# Patient Record
Sex: Male | Born: 1990 | Race: White | Hispanic: No | Marital: Single | Smoking: Never smoker
Health system: Southern US, Community
[De-identification: ages and names within clinical notes are randomized; demographics above are authoritative.]

---
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
## 2013-08-09 ENCOUNTER — Other Ambulatory Visit: Payer: Self-pay | Admitting: Internal Medicine

## 2013-08-09 LAB — TROPONIN I: Troponin-I: <0.02 ng/mL

## 2013-08-16 ENCOUNTER — Ambulatory Visit: Payer: Self-pay | Admitting: Internal Medicine

## 2015-08-16 IMAGING — CT CT ANGIO CHEST
2 of 6 series · 16 of 36 positions shown · IV contrast (APPLIED)
Comparison: None.

CLINICAL DATA: Chest pain.

EXAM:
CT ANGIOGRAPHY CHEST WITH CONTRAST
TECHNIQUE: Multidetector CT imaging of the chest was performed using the
standard protocol during bolus administration of intravenous
contrast. Multiplanar CT image reconstructions including MIPs were
obtained to evaluate the vascular anatomy.
CONTRAST:  100 cc Isovue 370.

[Series 8: routine chest with thins · axial · 0.67mm/px · z∈[-642,-388]mm · 15 of 244 slices shown]
[im 16/244  lung]
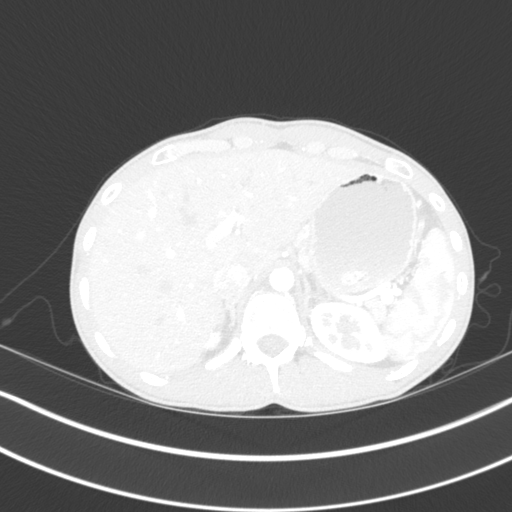
[im 31/244  mediastinal]
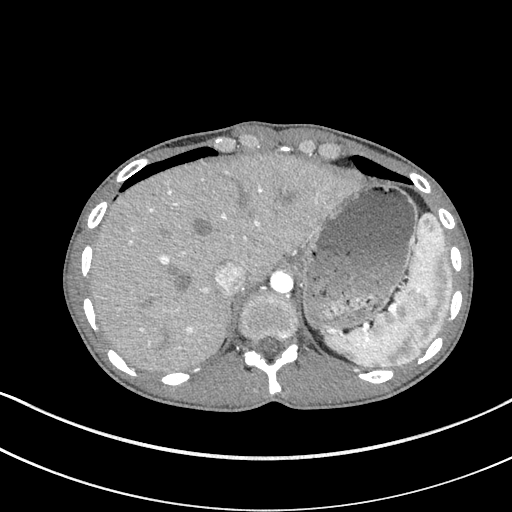
[im 46/244  lung]
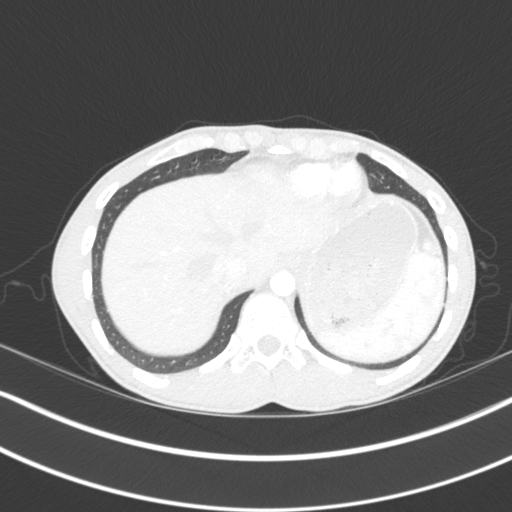
[im 61/244  mediastinal]
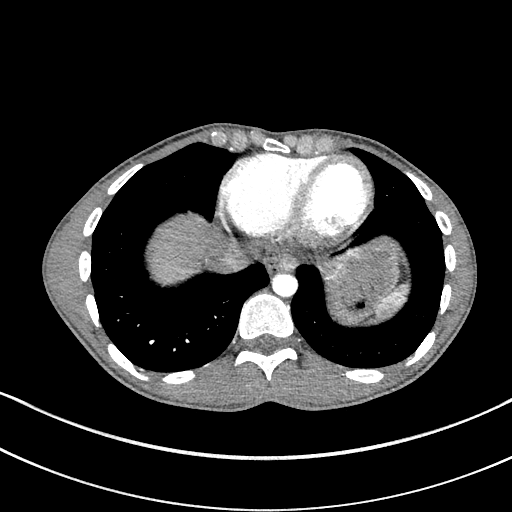
[im 76/244  lung]
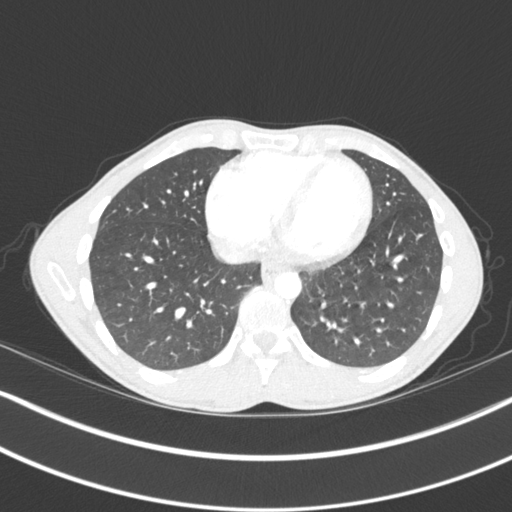
[im 92/244  mediastinal]
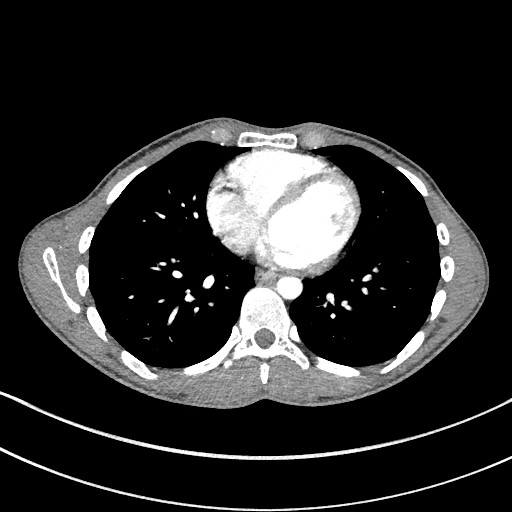
[im 107/244  lung]
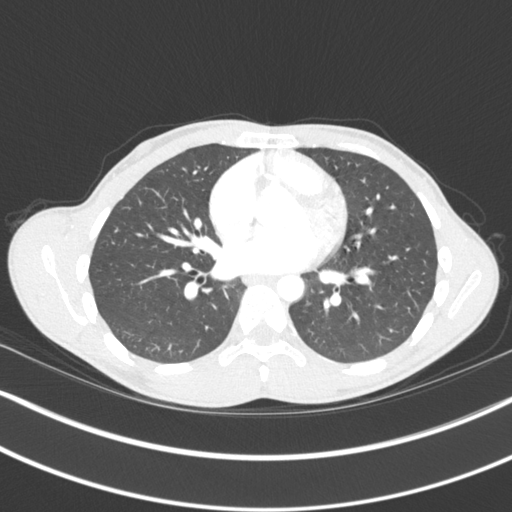
[im 122/244  mediastinal]
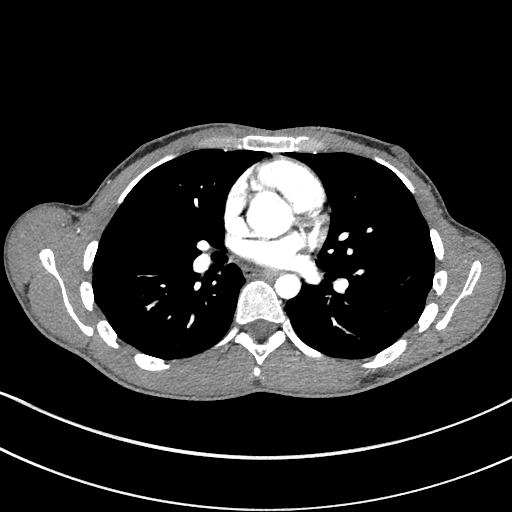
[im 137/244  lung]
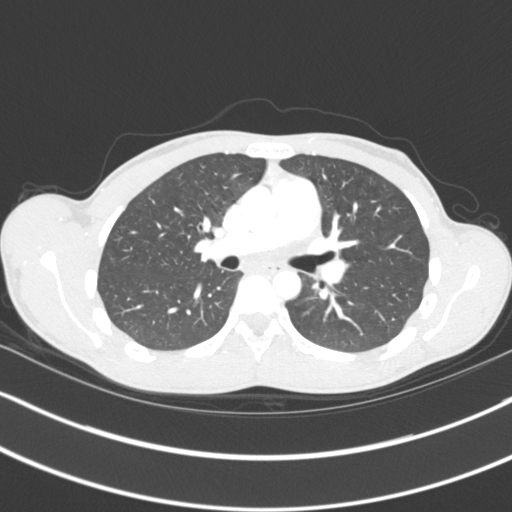
[im 152/244  mediastinal]
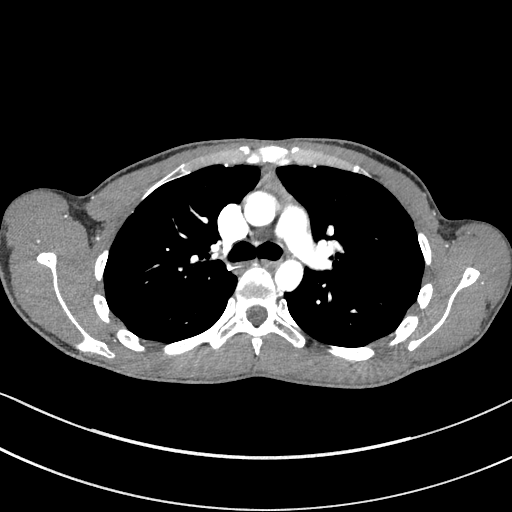
[im 168/244  lung]
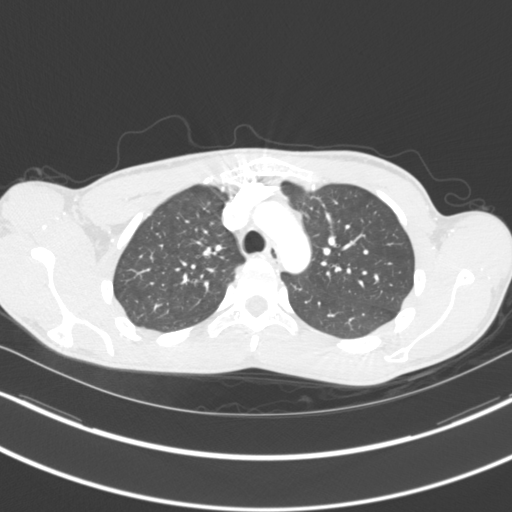
[im 183/244  mediastinal]
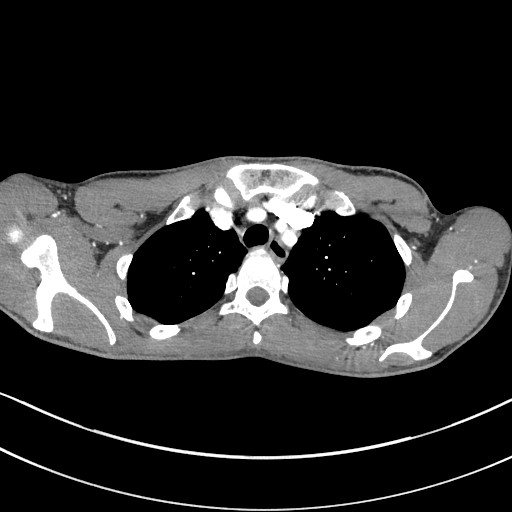
[im 198/244  lung]
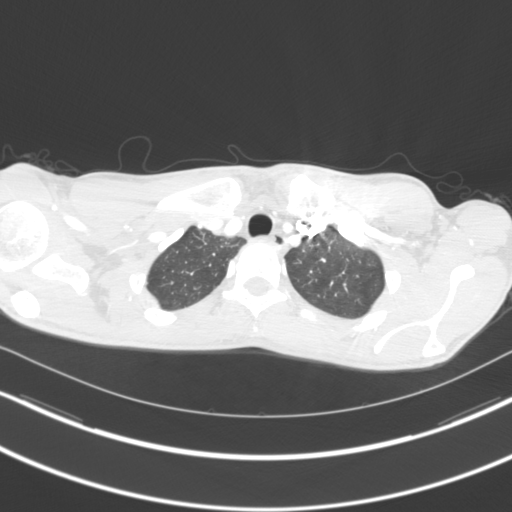
[im 213/244  mediastinal]
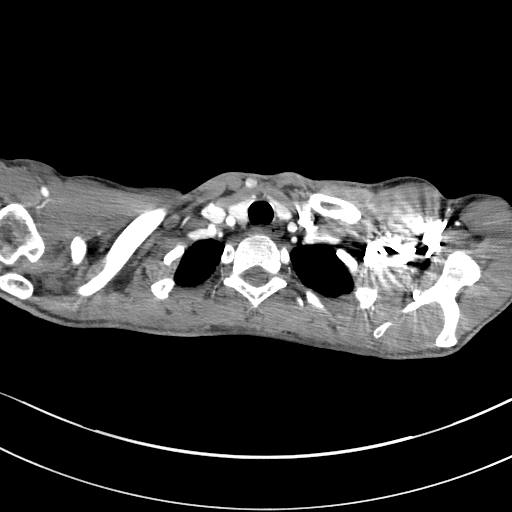
[im 228/244  lung]
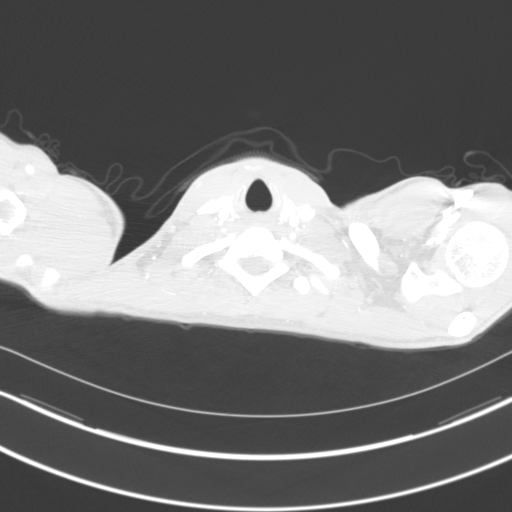

[Series 9: cor routine chest with · coronal · 0.57mm/px · 1 of 101 slices shown]
[im 51/101  mediastinal]
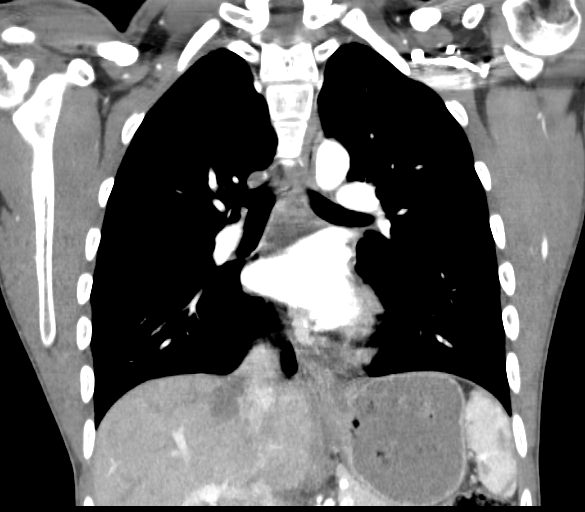

[16 of 36 positions shown; findings below may reference images not displayed]

FINDINGS: Thoracic aorta is unremarkable. Heart size normal. No pulmonary
embolus. Esophagus is unremarkable. Adrenals and visualized upper
abdominal viscera unremarkable.

Large airways are patent. Tiny nonspecific 2 mm nodule present in
the right apex, image 13./series 7. Tiny of approximately 3 mm
nodule present in the left lung base, image number 43/series 7. .
Mild pleural parenchymal thickening is noted bilaterally. These
changes are most likely inflammatory. A follow-up CT in 6 months to
1 year could be obtained to demonstrate stability. No pleural
effusion or pneumothorax.

Thyroid is normal. Supraclavicular regions are normal. Small
axillary lymph nodes are noted bilaterally. Chest wall is intact. No
acute osseous abnormality.

Review of the MIP images confirms the above findings.
IMPRESSION: 1. No evidence of pulmonary embolus. No acute cardiopulmonary
disease is identified.
2. Tiny nonspecific 2-3 mm pulmonary nodules as described above.
These are most likely inflammatory. Follow-up CT in 6-12 months can
be obtained to demonstrate stability.

## 2016-09-24 DIAGNOSIS — R509 Fever, unspecified: Secondary | ICD-10-CM | POA: Diagnosis not present

## 2016-09-24 DIAGNOSIS — J029 Acute pharyngitis, unspecified: Secondary | ICD-10-CM | POA: Diagnosis not present

## 2016-10-23 DIAGNOSIS — H43393 Other vitreous opacities, bilateral: Secondary | ICD-10-CM | POA: Diagnosis not present

## 2017-04-28 DIAGNOSIS — D649 Anemia, unspecified: Secondary | ICD-10-CM | POA: Diagnosis not present

## 2017-04-28 DIAGNOSIS — R202 Paresthesia of skin: Secondary | ICD-10-CM | POA: Diagnosis not present

## 2017-05-05 DIAGNOSIS — Z23 Encounter for immunization: Secondary | ICD-10-CM | POA: Diagnosis not present

## 2017-05-05 DIAGNOSIS — Z0000 Encounter for general adult medical examination without abnormal findings: Secondary | ICD-10-CM | POA: Diagnosis not present

## 2017-05-05 DIAGNOSIS — R200 Anesthesia of skin: Secondary | ICD-10-CM | POA: Diagnosis not present

## 2017-05-05 DIAGNOSIS — D649 Anemia, unspecified: Secondary | ICD-10-CM | POA: Diagnosis not present

## 2017-10-08 DIAGNOSIS — S92514A Nondisplaced fracture of proximal phalanx of right lesser toe(s), initial encounter for closed fracture: Secondary | ICD-10-CM | POA: Diagnosis not present

## 2017-10-08 DIAGNOSIS — M79671 Pain in right foot: Secondary | ICD-10-CM | POA: Diagnosis not present

## 2017-10-22 DIAGNOSIS — S92514D Nondisplaced fracture of proximal phalanx of right lesser toe(s), subsequent encounter for fracture with routine healing: Secondary | ICD-10-CM | POA: Diagnosis not present

## 2017-11-05 DIAGNOSIS — S92514D Nondisplaced fracture of proximal phalanx of right lesser toe(s), subsequent encounter for fracture with routine healing: Secondary | ICD-10-CM | POA: Diagnosis not present

## 2018-04-29 DIAGNOSIS — R200 Anesthesia of skin: Secondary | ICD-10-CM | POA: Diagnosis not present

## 2018-04-29 DIAGNOSIS — D649 Anemia, unspecified: Secondary | ICD-10-CM | POA: Diagnosis not present

## 2018-04-29 DIAGNOSIS — Z0000 Encounter for general adult medical examination without abnormal findings: Secondary | ICD-10-CM | POA: Diagnosis not present

## 2018-05-06 DIAGNOSIS — Z0000 Encounter for general adult medical examination without abnormal findings: Secondary | ICD-10-CM | POA: Diagnosis not present

## 2018-05-06 DIAGNOSIS — R200 Anesthesia of skin: Secondary | ICD-10-CM | POA: Diagnosis not present

## 2020-09-14 ENCOUNTER — Other Ambulatory Visit: Payer: Self-pay

## 2020-09-14 ENCOUNTER — Ambulatory Visit
Admission: RE | Admit: 2020-09-14 | Discharge: 2020-09-14 | Disposition: A | Discharge status: Home / Self Care | Payer: 59 | Source: Ambulatory Visit | Attending: Family Medicine | Admitting: Family Medicine

## 2020-09-14 VITALS — BP 130/89 | HR 104 | Temp 99.5°F | Resp 18 | Ht 66.0 in | Wt 118.0 lb

## 2020-09-14 DIAGNOSIS — R509 Fever, unspecified: Secondary | ICD-10-CM | POA: Insufficient documentation

## 2020-09-14 DIAGNOSIS — U071 COVID-19: Secondary | ICD-10-CM | POA: Diagnosis not present

## 2020-09-14 DIAGNOSIS — R0981 Nasal congestion: Secondary | ICD-10-CM | POA: Diagnosis not present

## 2020-09-14 DIAGNOSIS — J069 Acute upper respiratory infection, unspecified: Secondary | ICD-10-CM

## 2020-09-14 NOTE — ED Triage Notes (Signed)
Pt c/o sore throat, nasal congestion, fever, chills, body aches since Tuesday. Pt was possibly exposed to someone on Monday who had similar symptoms, he is not aware if they were COVID tested.

## 2020-09-14 NOTE — ED Provider Notes (Signed)
[facility]    CSN: [id number] Arrival date & time: 09/14/20  1510      History   Chief Complaint Chief Complaint  Patient presents with  . Sore Throat  . Nasal Congestion  . Generalized Body Aches    HPI Derek Galloway is a 30 y.o. male.   HPI   30 year old male here for evaluation of sore throat, nasal congestion, fever, chills, body aches, and runny nose.  Patient ports that his T-max was 100.  Patient denies ear pain or pressure, cough, shortness of breath, nausea, vomiting, or diarrhea.  Patient is not vaccinated as COVID.  Patient did have a COVID exposure 3 days ago.  History reviewed. No pertinent past medical history.  There are no problems to display for this patient.   History reviewed. No pertinent surgical history.     Home Medications    Prior to Admission medications   Not on File    Family History Family History  Problem Relation Age of Onset  . Healthy Mother   . Healthy Father     Social History Social History   Tobacco Use  . Smoking status: Never Smoker  . Smokeless tobacco: Never Used  Vaping Use  . Vaping Use: Never used  Substance Use Topics  . Alcohol use: Never  . Drug use: Never     Allergies   Patient has no known allergies.   Review of Systems Review of Systems  Constitutional: Positive for chills. Negative for activity change, appetite change and fever.  HENT: Positive for congestion, rhinorrhea and sore throat. Negative for ear pain.   Respiratory: Negative for cough, shortness of breath and wheezing.   Gastrointestinal: Positive for nausea. Negative for diarrhea and vomiting.  Musculoskeletal: Positive for arthralgias and myalgias.  Skin: Negative for rash.  Hematological: Negative.   Psychiatric/Behavioral: Negative.      Physical Exam Triage Vital Signs ED Triage Vitals  Enc Vitals Group     BP 09/14/20 1531 130/89     Pulse Rate 09/14/20 1531 (!) 104     Resp 09/14/20 1531 18     Temp  09/14/20 1531 99.5 F (37.5 C)     Temp Source 09/14/20 1531 Oral     SpO2 09/14/20 1531 100 %     Weight 09/14/20 1528 118 lb (53.5 kg)     Height 09/14/20 1528 5\' 6"  (1.676 m)     Head Circumference --      Peak Flow --      Pain Score 09/14/20 1527 7     Pain Loc --      Pain Edu? --      Excl. in GC? --    No data found.  Updated Vital Signs BP 130/89 (BP Location: Left Arm)   Pulse (!) 104   Temp 99.5 F (37.5 C) (Oral)   Resp 18   Ht 5\' 6"  (1.676 m)   Wt 118 lb (53.5 kg)   SpO2 100%   BMI 19.05 kg/m   Visual Acuity Right Eye Distance:   Left Eye Distance:   Bilateral Distance:    Right Eye Near:   Left Eye Near:    Bilateral Near:     Physical Exam Vitals and nursing note reviewed.  Constitutional:      General: He is not in acute distress.    Appearance: He is well-developed.  HENT:     Head: Normocephalic and atraumatic.     Right Ear: Tympanic membrane and  ear canal normal. Tympanic membrane is not erythematous.     Left Ear: Tympanic membrane and ear canal normal.     Nose: Congestion and rhinorrhea present.     Mouth/Throat:     Mouth: Mucous membranes are moist.     Pharynx: Oropharynx is clear. Posterior oropharyngeal erythema present.     Tonsils: No tonsillar exudate. 0 on the right. 0 on the left.  Cardiovascular:     Rate and Rhythm: Normal rate and regular rhythm.     Heart sounds: Normal heart sounds. No murmur heard.   Pulmonary:     Effort: Pulmonary effort is normal.     Breath sounds: Normal breath sounds. No wheezing, rhonchi or rales.  Musculoskeletal:     Cervical back: Normal range of motion and neck supple.  Lymphadenopathy:     Cervical: No cervical adenopathy.  Skin:    General: Skin is warm and dry.     Capillary Refill: Capillary refill takes less than 2 seconds.     Findings: No erythema or rash.  Neurological:     General: No focal deficit present.     Mental Status: He is alert and oriented to person, place, and  time.  Psychiatric:        Mood and Affect: Mood normal.        Behavior: Behavior normal.      UC Treatments / Results  Labs (all labs ordered are listed, but only abnormal results are displayed) Labs Reviewed  SARS CORONAVIRUS 2 (TAT 6-24 HRS)    EKG   Radiology No results found.  Procedures Procedures (including critical care time)  Medications Ordered in UC Medications - No data to display  Initial Impression / Assessment and Plan / UC Course  I have reviewed the triage vital signs and the nursing notes.  Pertinent labs & imaging results that were available during my care of the patient were reviewed by me and considered in my medical decision making (see chart for details).   Patient is here for evaluation of COVID-like symptoms that started 2 days ago.  Patient is in no acute distress.  Physical exam reveals erythematous and edematous nasal mucosa with clear nasal discharge.  Posterior oropharynx is also erythematous with clear postnasal drip.  Tonsillar pillars are unremarkable.  Patient has no cervical lymphadenopathy on exam.  Lung sounds are clear to auscultation all fields.  Will swab patient for COVID and discharge home with supportive care and ER precautions.   Final Clinical Impressions(s) / UC Diagnoses   Final diagnoses:  Upper respiratory tract infection, unspecified type     Discharge Instructions     Isolate at home until the results of your Covid test are back.  If your test is positive then you will have to quarantine for 5 days from the onset of your symptoms.  After 5 days you can break quarantine if your symptoms have improved and you have not had a fever for 24 hours.  Use over-the-counter Tylenol and ibuprofen as needed for fever and body aches.  If you develop any shortness of breath-especially at rest, you are unable to speak in full sentences, or is a late sign your lips start turning blue you need to go to the ER for evaluation.    ED  Prescriptions    None     PDMP not reviewed this encounter.   Becky Augusta, NP 09/14/20 [id number]

## 2020-09-14 NOTE — Discharge Instructions (Addendum)
Isolate at home until the results of your Covid test are back.  If your test is positive then you will have to quarantine for 5 days from the onset of your symptoms.  After 5 days you can break quarantine if your symptoms have improved and you have not had a fever for 24 hours.  Use over-the-counter Tylenol and ibuprofen as needed for fever and body aches.  If you develop any shortness of breath-especially at rest, you are unable to speak in full sentences, or is a late sign your lips start turning blue you need to go to the ER for evaluation.

## 2020-09-15 LAB — SARS CORONAVIRUS 2 (TAT 6-24 HRS): SARS Coronavirus 2: POSITIVE — AB
